# Patient Record
Sex: Female | Born: 1990 | Race: Black or African American | Hispanic: No | Marital: Single | Smoking: Never smoker
Health system: Southern US, Community
[De-identification: ages and names within clinical notes are randomized; demographics above are authoritative.]

## PROBLEM LIST (undated history)

## (undated) DIAGNOSIS — J302 Other seasonal allergic rhinitis: Secondary | ICD-10-CM

## (undated) HISTORY — PX: WISDOM TOOTH EXTRACTION: SHX21

---

## 2017-07-18 DIAGNOSIS — E6609 Other obesity due to excess calories: Secondary | ICD-10-CM | POA: Insufficient documentation

## 2017-11-16 ENCOUNTER — Encounter (HOSPITAL_BASED_OUTPATIENT_CLINIC_OR_DEPARTMENT_OTHER): Payer: Self-pay | Admitting: *Deleted

## 2017-11-16 ENCOUNTER — Other Ambulatory Visit: Payer: Self-pay

## 2017-11-16 ENCOUNTER — Emergency Department (HOSPITAL_BASED_OUTPATIENT_CLINIC_OR_DEPARTMENT_OTHER)
Admission: EM | Admit: 2017-11-16 | Discharge: 2017-11-16 | Disposition: A | Discharge status: Home / Self Care | Payer: BLUE CROSS/BLUE SHIELD | Attending: Emergency Medicine | Admitting: Emergency Medicine

## 2017-11-16 DIAGNOSIS — H6121 Impacted cerumen, right ear: Secondary | ICD-10-CM | POA: Insufficient documentation

## 2017-11-16 DIAGNOSIS — H9201 Otalgia, right ear: Secondary | ICD-10-CM | POA: Diagnosis present

## 2017-11-16 HISTORY — DX: Other seasonal allergic rhinitis: J30.2

## 2017-11-16 NOTE — ED Notes (Signed)
EDP into room, prior to RN assessment, see MD notes, pending orders.   

## 2017-11-16 NOTE — ED Provider Notes (Signed)
[facility] EMERGENCY DEPARTMENT Provider Note   CSN: [id number] Arrival date & time: 11/16/17  0229     History   Chief Complaint Chief Complaint  Patient presents with  . right ear wax buildup    HPI Linda Sampson is a 26 y.o. female.  The history is provided by the patient.  Otalgia  This is a new problem. The current episode started more than 1 week ago. There is pain in the right ear. The problem occurs constantly. The problem has not changed since onset.There has been no fever. The pain is mild. Pertinent negatives include no ear discharge and no rhinorrhea. Associated symptoms comments: Wax won't come out. Her past medical history does not include tympanostomy tube.    Past Medical History:  Diagnosis Date  . Seasonal allergies     There are no active problems to display for this patient.   Past Surgical History:  Procedure Laterality Date  . WISDOM TOOTH EXTRACTION      OB History    No data available       Home Medications    Prior to Admission medications   Not on File    Family History No family history on file.  Social History Social History   Tobacco Use  . Smoking status: Never Smoker  . Smokeless tobacco: Never Used  Substance Use Topics  . Alcohol use: Yes    Comment: social   . Drug use: No     Allergies   Shellfish allergy   Review of Systems Review of Systems  Constitutional: Negative for fever.  HENT: Positive for ear pain. Negative for ear discharge, rhinorrhea, sinus pain, tinnitus, trouble swallowing and voice change.   Respiratory: Negative for shortness of breath.   Neurological: Negative for speech difficulty.  All other systems reviewed and are negative.    Physical Exam Updated Vital Signs BP (!) 120/98   Pulse 85   Temp (!) 97.5 F (36.4 C)   Resp 18   Ht 5' (1.524 m)   Wt 81.6 kg (180 lb)   LMP 10/16/2017 (Approximate)   SpO2 100%   BMI 35.15 kg/m   Physical Exam  Constitutional: She  is oriented to person, place, and time. She appears well-developed and well-nourished. No distress.  HENT:  Head: Normocephalic and atraumatic.  Mouth/Throat: Oropharynx is clear and moist. No oropharyngeal exudate.  Impacted cerumen in the right canal left TM normal  Eyes: Conjunctivae are normal. Pupils are equal, round, and reactive to light.  Neck: Normal range of motion. Neck supple.  Cardiovascular: Normal rate, regular rhythm, normal heart sounds and intact distal pulses.  Pulmonary/Chest: Effort normal and breath sounds normal. No stridor. She has no wheezes. She has no rales.  Abdominal: Soft. Bowel sounds are normal. She exhibits no mass. There is no tenderness. There is no rebound and no guarding.  Musculoskeletal: Normal range of motion.  Neurological: She is alert and oriented to person, place, and time.  Skin: Skin is warm and dry. Capillary refill takes less than 2 seconds.  Psychiatric: She has a normal mood and affect.     ED Treatments / Results   Vitals:   11/16/17 0238  BP: (!) 120/98  Pulse: 85  Resp: 18  Temp: (!) 97.5 F (36.4 C)  SpO2: 100%    Procedures Procedures (including critical care time) Irrigated right ear with resolution of wax and symptom     Final Clinical Impressions(s) / ED Diagnoses   Final diagnoses:  Impacted cerumen of right ear    All questions answered to the patient's satisfaction.    Strict return precautions for fever, inability to open the mouth, inability to speech, swelling behind the ear or in the neck, fevers,  global weakness, vomiting, swelling or the lips or tongue, chest pain, dyspnea on exertion,shortness of breath, persistent pain, Inability to tolerate liquids or food, cough, altered mental status or any concerns. No signs of systemic illness or infection. The patient is nontoxic-appearing on exam and vital signs are within normal limits.    I have reviewed the triage vital signs and the nursing notes. Pertinent  labs &imaging results that were available during my care of the patient were reviewed by me and considered in my medical decision making (see chart for details).  After history, exam, and medical workup I feel the patient has been appropriately medically screened and is safe for discharge home. Pertinent diagnoses were discussed with the patient. Patient was given return precautions     Justinn Welter, MD 11/16/17 [phone]

## 2017-11-16 NOTE — ED Notes (Signed)
Alert, NAD, calm, interactive, resps e/u, speaking in clear complete sentences, no dyspnea noted, skin W&D, VSS, here for ear wax removal of R ear, reports muffled/decreased hearing, and some dizziness, EMT finished with ear wax removal/ ear irrigation, TMs clear, EAC clear and irritated post irrigation, (denies: pain, fever, URI sx, sore throat, NV). States, dizziness resolving. "feel better".

## 2020-12-26 ENCOUNTER — Emergency Department (HOSPITAL_BASED_OUTPATIENT_CLINIC_OR_DEPARTMENT_OTHER): Payer: BC Managed Care – PPO

## 2020-12-26 ENCOUNTER — Other Ambulatory Visit: Payer: Self-pay

## 2020-12-26 ENCOUNTER — Encounter (HOSPITAL_BASED_OUTPATIENT_CLINIC_OR_DEPARTMENT_OTHER): Payer: Self-pay | Admitting: Emergency Medicine

## 2020-12-26 ENCOUNTER — Emergency Department (HOSPITAL_BASED_OUTPATIENT_CLINIC_OR_DEPARTMENT_OTHER)
Admission: EM | Admit: 2020-12-26 | Discharge: 2020-12-26 | Disposition: A | Discharge status: Home / Self Care | Payer: BC Managed Care – PPO | Attending: Emergency Medicine | Admitting: Emergency Medicine

## 2020-12-26 DIAGNOSIS — W548XXA Other contact with dog, initial encounter: Secondary | ICD-10-CM | POA: Diagnosis not present

## 2020-12-26 DIAGNOSIS — S6991XA Unspecified injury of right wrist, hand and finger(s), initial encounter: Secondary | ICD-10-CM | POA: Diagnosis present

## 2020-12-26 DIAGNOSIS — S62646A Nondisplaced fracture of proximal phalanx of right little finger, initial encounter for closed fracture: Secondary | ICD-10-CM | POA: Insufficient documentation

## 2020-12-26 DIAGNOSIS — Y93K1 Activity, walking an animal: Secondary | ICD-10-CM | POA: Diagnosis not present

## 2020-12-26 MED ORDER — IBUPROFEN 800 MG PO TABS
ORAL_TABLET | ORAL | Status: AC
  Filled 2020-12-26: qty 1

## 2020-12-26 NOTE — Discharge Instructions (Addendum)
Wear splint as applied until followed up by orthopedics.  Ice for 20 minutes every 2 hours while awake for the next 2 days.  Take ibuprofen 600 mg every 6 hours as needed for pain.  Follow-up with hand surgery in the next 1 to 2 days.  The contact information for Dr. Arita Miss has been provided in this discharge summary for you to call and make these arrangements.

## 2020-12-26 NOTE — ED Triage Notes (Signed)
Pt sustained injuryy ot left pinky and ring finger when they got tangled in dog leash. Pt has swelling and bruising to pinky finger.

## 2020-12-26 NOTE — ED Provider Notes (Signed)
[facility] EMERGENCY DEPARTMENT Provider Note   CSN: [id number] Arrival date & time: 12/26/20  0119     History Chief Complaint  Patient presents with  . Hand Injury    Linda Sampson is a 30 y.o. female.  Patient is a 30 year old female with no significant past medical history.  She presents today for evaluation of hand injury.  Patient was walking her dog when the dog became excited by the presence of another dog, then ran.  This caused the leash to pull her hand and injured her right 5th finger.  The history is provided by the patient.  Hand Injury Location:  Finger Finger location:  R little finger Injury: yes   Pain details:    Quality:  Aching   Radiates to:  Does not radiate   Severity:  Moderate   Onset quality:  Sudden   Timing:  Constant   Progression:  Unchanged      Past Medical History:  Diagnosis Date  . Seasonal allergies     There are no problems to display for this patient.   Past Surgical History:  Procedure Laterality Date  . WISDOM TOOTH EXTRACTION       OB History   No obstetric history on file.     No family history on file.  Social History   Tobacco Use  . Smoking status: Never Smoker  . Smokeless tobacco: Never Used  Vaping Use  . Vaping Use: Never used  Substance Use Topics  . Alcohol use: Yes    Comment: social   . Drug use: No    Home Medications Prior to Admission medications   Not on File    Allergies    Shellfish allergy  Review of Systems   Review of Systems  All other systems reviewed and are negative.   Physical Exam Updated Vital Signs BP (!) 137/93 (BP Location: Left Arm)   Pulse 97   Temp 98.3 F (36.8 C) (Oral)   Resp 16   Ht 5' (1.524 m)   Wt 108.9 kg   SpO2 100%   BMI 46.87 kg/m   Physical Exam Vitals and nursing note reviewed.  Constitutional:      Appearance: Normal appearance. She is not toxic-appearing or diaphoretic.  HENT:     Head: Normocephalic and atraumatic.   Pulmonary:     Effort: Pulmonary effort is normal.  Musculoskeletal:     Comments: There is swelling to the right proximal phalanx.  Distal sensation and cap refill is intact.  Skin:    General: Skin is warm and dry.  Neurological:     Mental Status: She is alert.     ED Results / Procedures / Treatments   Labs (all labs ordered are listed, but only abnormal results are displayed) Labs Reviewed - No data to display  EKG None  Radiology DG Hand Complete Left  Result Date: 12/26/2020 CLINICAL DATA:  Left fifth digit injury, pain and swelling EXAM: LEFT HAND - COMPLETE 3+ VIEW COMPARISON:  None. FINDINGS: Frontal, oblique, lateral views of the left hand are obtained. There is a minimally displaced oblique fracture through the diaphysis of the fifth proximal phalanx. The fracture is extra-articular. No other acute bony abnormalities. Joint spaces are well preserved. Soft tissue swelling of the fifth digit. IMPRESSION: 1. Minimally displaced oblique fifth proximal phalangeal diaphyseal fracture. Electronically Signed   By: Sharlet Salina M.D.   On: 12/26/2020 01:59    Procedures Procedures (including critical care time)  Medications Ordered in ED Medications  ibuprofen (ADVIL) 800 MG tablet (  Not Given 12/26/20 0353)    ED Course  I have reviewed the triage vital signs and the nursing notes.  Pertinent labs & imaging results that were available during my care of the patient were reviewed by me and considered in my medical decision making (see chart for details).    MDM Rules/Calculators/A&P  X-rays show a nondisplaced oblique fracture of the 5th proximal phalanx.  This will be placed in an ulnar gutter splint and patient will follow up with hand surgery.  Final Clinical Impression(s) / ED Diagnoses Final diagnoses:  None    Rx / DC Orders ED Discharge Orders    None       Geoffery Lyons, MD 12/26/20 [phone]

## 2020-12-28 ENCOUNTER — Ambulatory Visit (INDEPENDENT_AMBULATORY_CARE_PROVIDER_SITE_OTHER): Payer: BC Managed Care – PPO | Admitting: Plastic Surgery

## 2020-12-28 ENCOUNTER — Other Ambulatory Visit: Payer: Self-pay

## 2020-12-28 ENCOUNTER — Encounter: Payer: Self-pay | Admitting: Plastic Surgery

## 2020-12-28 VITALS — BP 116/77 | HR 100 | Temp 98.7°F | Ht 60.0 in | Wt 245.2 lb

## 2020-12-28 DIAGNOSIS — S62616A Displaced fracture of proximal phalanx of right little finger, initial encounter for closed fracture: Secondary | ICD-10-CM

## 2020-12-28 NOTE — Progress Notes (Signed)
   Referring Provider Devra Dopp, MD [address] [place],  [place] [address]   CC:  Chief Complaint  Patient presents with  . Advice Only      Linda Sampson is an 30 y.o. female.  HPI: Patient presents with a minimally displaced fracture of the right small finger proximal phalanx.  This occurred while she was walking her dogs.  She was seen in the emergency room and placed in a splint and sent to me.  She feels like things are getting better and wants to know what her options are.  Allergies  Allergen Reactions  . Shellfish Allergy     No outpatient encounter medications on file as of 12/28/2020.   No facility-administered encounter medications on file as of 12/28/2020.     Past Medical History:  Diagnosis Date  . Seasonal allergies     Past Surgical History:  Procedure Laterality Date  . WISDOM TOOTH EXTRACTION      No family history on file.  Social History   Social History Narrative  . Not on file     Review of Systems General: Denies fevers, chills, weight loss CV: Denies chest pain, shortness of breath, palpitations  Physical Exam Vitals with BMI 12/28/2020 12/26/2020 12/26/2020  Height 5\' 0"  - -  Weight 245 lbs 3 oz - -  BMI 47.89 - -  Systolic 116 122  Diastolic 77 83 93  Pulse 100 93 97    General:  No acute distress,  Alert and oriented, Non-Toxic, Normal speech and affect Right hand: Fingers well-perfused with normal capillary refill to palp radial pulse.  Sensation is intact throughout.  She has reasonable flexion extension of all fingers.  There is no rotational component to the small finger injury and not much swelling in that area.  X-ray shows a minimally displaced fracture through the shaft of the small finger proximal phalanx.  There is a little bit of volar displacement but is not too bad.  Assessment/Plan Patient presents with a minimally displaced fracture of the small finger proximal phalanx.  I recommended nonoperative treatment.  We did  discuss surgical versus nonsurgical treatment and she is in agreement.  I suspect that her function will be optimized with nonoperative treatment.  I recommended hand therapy to assist with a customized splint and I will plan to see her again in 3 to 4 weeks time.  At that point she can more likely come out of the splint and begin range of motion exercises.  All of her questions were answered and we will plan to see her at that point.  093 12/28/2020, 2:17 PM

## 2021-01-25 ENCOUNTER — Other Ambulatory Visit: Payer: Self-pay

## 2021-01-25 ENCOUNTER — Encounter: Payer: Self-pay | Admitting: Plastic Surgery

## 2021-01-25 ENCOUNTER — Ambulatory Visit: Payer: BC Managed Care – PPO | Admitting: Plastic Surgery

## 2021-01-25 VITALS — BP 129/76 | HR 88

## 2021-01-25 DIAGNOSIS — S62616A Displaced fracture of proximal phalanx of right little finger, initial encounter for closed fracture: Secondary | ICD-10-CM

## 2021-01-25 NOTE — Progress Notes (Signed)
   Referring Provider Lewis Moccasin, MD [address] [place],  [place] [address]   CC:  Chief Complaint  Patient presents with  . Follow-up      Linda Sampson is an 30 y.o. female.  HPI: Patient presents about a month out from injury to her left small finger.  She had a proximal phalanx fracture that was minimally displaced.  She has been in a hand-based splint and has tolerated that fine.  She has not tried to move it much throughout the past few weeks while the bone has been healing.  Now she says most of her pain is gone and she would like to start working on getting her function back.  Review of Systems General: Denies fevers or chills  Physical Exam Vitals with BMI 01/25/2021 12/28/2020 12/26/2020  Height - 5\' 0"  -  Weight - 245 lbs 3 oz -  BMI - 47.89 -  Systolic 129 116  Diastolic 76 77 83  Pulse 88 100 93    General:  No acute distress,  Alert and oriented, Non-Toxic, Normal speech and affect Examination of the left hand shows no swelling.  She is nontender along the small finger.  She is understandably stiff after being immobilized for the last few weeks.  There is no external malalignment.  Assessment/Plan Patient presents with what looks like healing of the small finger proximal phalanx fracture.  I think she is ready for active and passive range of motion.  I told her she can discontinue her splint if she would like.  I will plan to have her go to therapy and follow-up with 809 again in 2 months to check her progress.  All her questions were answered.  [place] 01/25/2021, 10:50 AM

## 2021-03-28 ENCOUNTER — Ambulatory Visit: Payer: BC Managed Care – PPO | Admitting: Plastic Surgery

## 2021-04-25 ENCOUNTER — Ambulatory Visit: Payer: BC Managed Care – PPO | Admitting: Plastic Surgery

## 2021-05-23 IMAGING — CR DG HAND COMPLETE 3+V*L*
3 series · 3 of 3 positions shown · non-contrast
Comparison: None.

CLINICAL DATA: Left fifth digit injury, pain and swelling

EXAM:
LEFT HAND - COMPLETE 3+ VIEW

[x hand pa left]
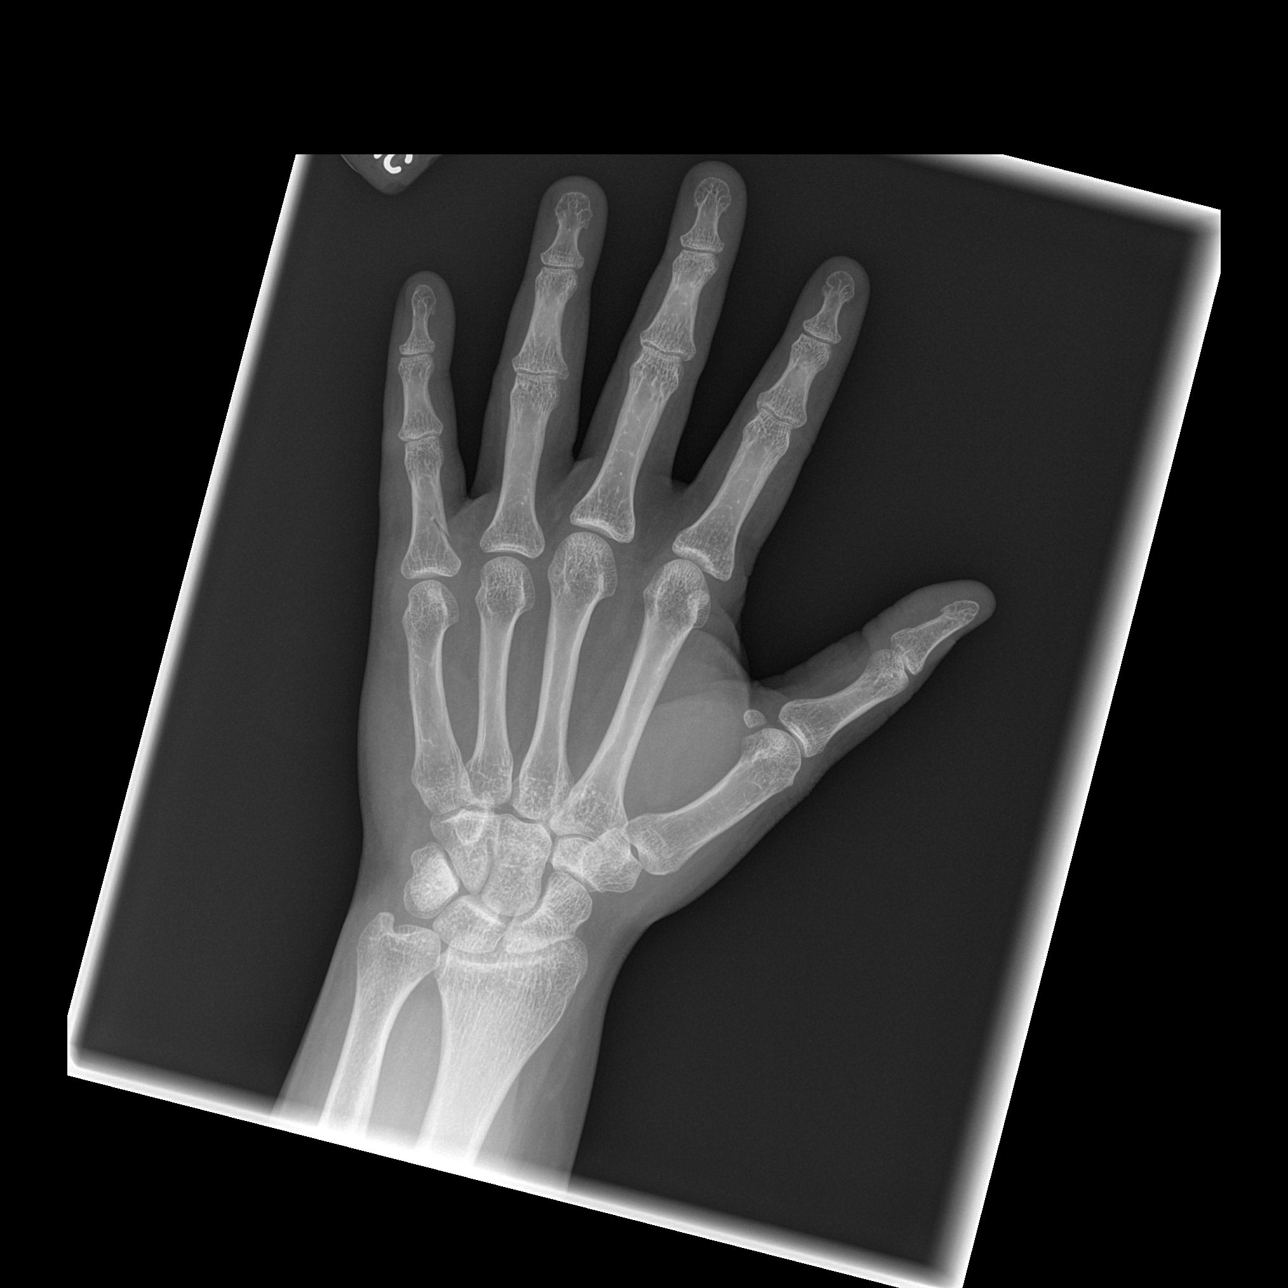

[x hand oblique left]
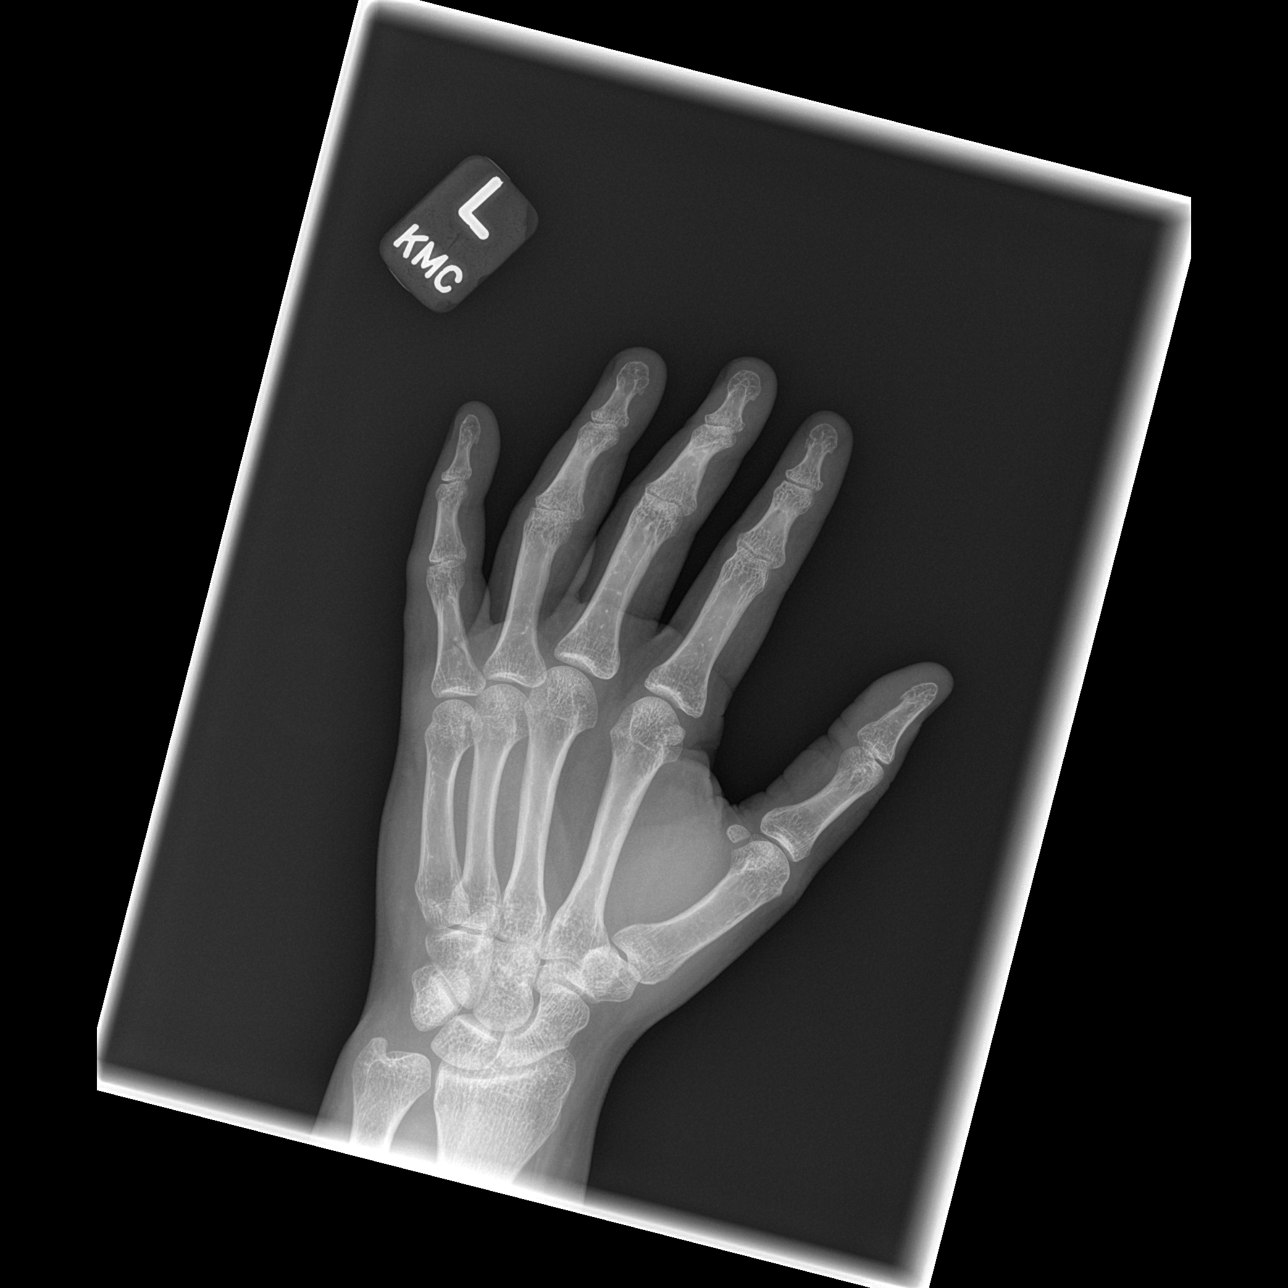

[x hand lat left]
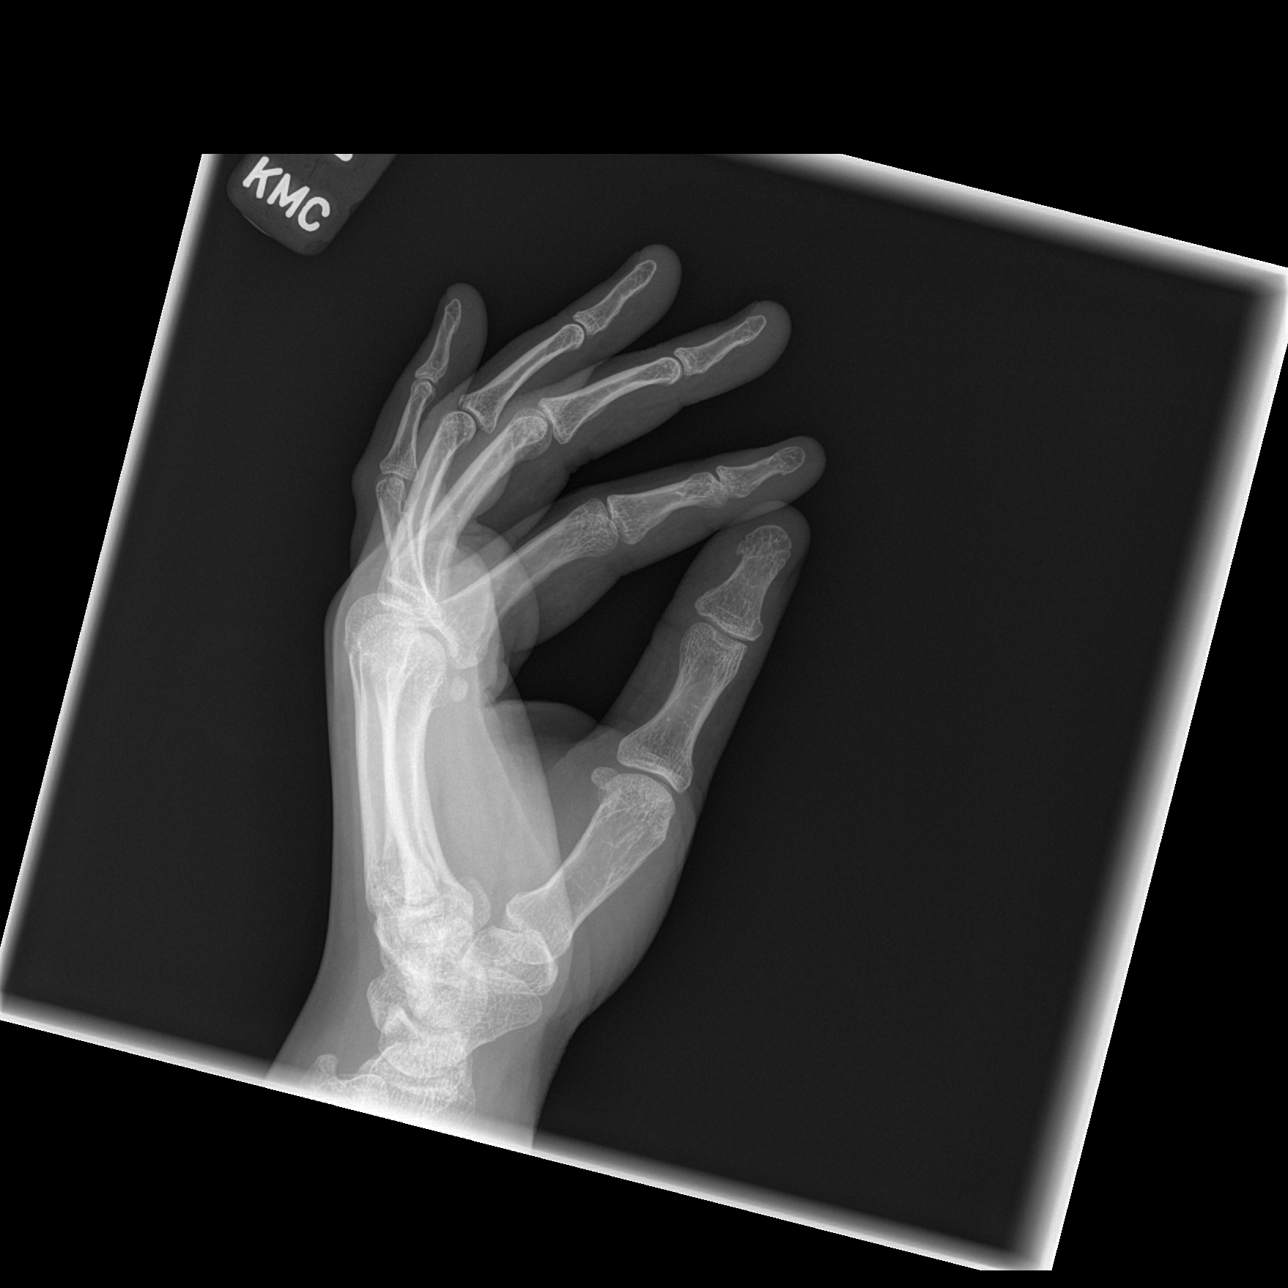

[3 of 3 positions shown; findings below may reference images not displayed]

FINDINGS: Frontal, oblique, lateral views of the left hand are obtained. There
is a minimally displaced oblique fracture through the diaphysis of
the fifth proximal phalanx. The fracture is extra-articular. No
other acute bony abnormalities. Joint spaces are well preserved.
Soft tissue swelling of the fifth digit.
IMPRESSION: 1. Minimally displaced oblique fifth proximal phalangeal diaphyseal
fracture.

## 2021-09-19 ENCOUNTER — Ambulatory Visit: Payer: BC Managed Care – PPO

## 2021-09-26 ENCOUNTER — Ambulatory Visit: Payer: BC Managed Care – PPO

## 2021-10-11 ENCOUNTER — Ambulatory Visit: Payer: BC Managed Care – PPO | Admitting: Nurse Practitioner

## 2021-10-11 ENCOUNTER — Encounter: Payer: Self-pay | Admitting: Nurse Practitioner

## 2021-10-11 ENCOUNTER — Other Ambulatory Visit: Payer: Self-pay

## 2021-10-11 VITALS — BP 129/79 | HR 71 | Resp 20

## 2021-10-11 DIAGNOSIS — Z0000 Encounter for general adult medical examination without abnormal findings: Secondary | ICD-10-CM

## 2021-10-11 DIAGNOSIS — Z113 Encounter for screening for infections with a predominantly sexual mode of transmission: Secondary | ICD-10-CM

## 2021-10-11 NOTE — Progress Notes (Signed)
   Subjective:    Patient ID: Linda Sampson, female    DOB: June 01, 1991, 30 y.o.   MRN: [id number]  HPI  30 year old female presenting to [facility] with request for STI testing. She has never had STI testing in the past. Most recent new encounter was 85 days ago. Denies symptoms or concern.   Recently going through separation.   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   Review of Systems  Constitutional: Negative.   HENT: Negative.    Respiratory: Negative.    Cardiovascular: Negative.   Gastrointestinal: Negative.   Genitourinary: Negative.   Musculoskeletal: Negative.   Neurological: Negative.   Hematological: Negative.   Psychiatric/Behavioral: Negative.        Objective:   Physical Exam Constitutional:      Appearance: Normal appearance.  HENT:     Head: Normocephalic.  Neurological:     General: No focal deficit present.     Mental Status: She is alert.  Psychiatric:        Mood and Affect: Mood normal.          Assessment & Plan:   Orders Placed This Encounter  Procedures   Chlamydia/Gonococcus/Trichomonas, NAA    Order Specific Question:   Release to patient    Answer:   Immediate   HIV Antibody (routine testing w rflx)    Order Specific Question:   Release to patient    Answer:   Immediate   RPR Qual    Order Specific Question:   Release to patient    Answer:   Immediate    Will follow up with lab results when available RTC as needed  She may also return for full labwork when she would like fasting as requested and will forward panel to PCP once established

## 2021-10-12 ENCOUNTER — Encounter: Payer: Self-pay | Admitting: Nurse Practitioner

## 2021-10-12 LAB — RPR QUALITATIVE: RPR Ser Ql: NONREACTIVE

## 2021-10-12 LAB — HIV ANTIBODY (ROUTINE TESTING W REFLEX): HIV Screen 4th Generation wRfx: NONREACTIVE

## 2021-10-15 ENCOUNTER — Encounter: Payer: Self-pay | Admitting: Nurse Practitioner

## 2021-10-15 LAB — CHLAMYDIA/GONOCOCCUS/TRICHOMONAS, NAA
Chlamydia by NAA: NEGATIVE
Gonococcus by NAA: NEGATIVE
Trich vag by NAA: NEGATIVE

## 2021-10-17 ENCOUNTER — Other Ambulatory Visit: Payer: Self-pay

## 2021-10-17 ENCOUNTER — Ambulatory Visit: Payer: BC Managed Care – PPO

## 2021-10-17 DIAGNOSIS — Z23 Encounter for immunization: Secondary | ICD-10-CM
